# Patient Record
Sex: Male | Born: 1990 | Race: White | Hispanic: No | Marital: Single | Smoking: Former smoker
Health system: Southern US, Community
[De-identification: ages and names within clinical notes are randomized; demographics above are authoritative.]

## PROBLEM LIST (undated history)

## (undated) HISTORY — PX: NOSE SURGERY: SHX723

---
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
## 2010-07-04 ENCOUNTER — Ambulatory Visit (HOSPITAL_BASED_OUTPATIENT_CLINIC_OR_DEPARTMENT_OTHER)
Admission: RE | Admit: 2010-07-04 | Discharge: 2010-07-04 | Disposition: A | Discharge status: Home / Self Care | Payer: BC Managed Care – PPO | Source: Ambulatory Visit | Attending: Otolaryngology | Admitting: Otolaryngology

## 2010-07-04 DIAGNOSIS — J342 Deviated nasal septum: Secondary | ICD-10-CM | POA: Insufficient documentation

## 2010-07-04 DIAGNOSIS — J343 Hypertrophy of nasal turbinates: Secondary | ICD-10-CM | POA: Insufficient documentation

## 2010-07-04 LAB — POCT HEMOGLOBIN-HEMACUE: Hemoglobin: 15.7 g/dL (ref 13.0–17.0)

## 2010-07-11 NOTE — Op Note (Signed)
NAME:  Zachary Crawford, SITTER             ACCOUNT NO.:  [id number]  MEDICAL RECORD NO.:  [id number]           PATIENT TYPE:  LOCATION:                                 FACILITY:  PHYSICIAN:  Newman Pies, MD                 DATE OF BIRTH:  DATE OF PROCEDURE:  07/04/2010 DATE OF DISCHARGE:                              OPERATIVE REPORT   SURGEON:  Newman Pies, MD.  PREOPERATIVE DIAGNOSES: 1. Nasal septal deviation. 2. Bilateral inferior turbinate hypertrophy.  POSTOPERATIVE DIAGNOSES: 1. Nasal septal deviation. 2. Bilateral inferior turbinate hypertrophy.  PROCEDURE PERFORMED: 1. Septoplasty. 2. Bilateral partial inferior turbinate resection.  ANESTHESIA:  General endotracheal tube anesthesia.  COMPLICATIONS:  None.  ESTIMATED BLOOD LOSS:  50 mL.  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  The obstruction was usually worse on the left side.  The patient was previously treated with multiple steroid nasal sprays, antihistamine, and decongestants without improvement in his symptoms.  On examination, he was noted to have a severe nasal septal deviation to the left with a large left nasal septal spur.  His inferior turbinates were also significantly hypertrophied.  Based on the above findings, the decision was made for the patient to undergo the above-stated procedures.  The risks, benefits, alternatives, and details of the procedure of the procedures were discussed with the patient and his mother.  Questions were invited and answered.  Informed consent was obtained.  DESCRIPTION:  The patient was taken to the operating room and placed supine on the operating table.  General endotracheal tube anesthesia was administered by the anesthesiologist.  Preop IV antibiotics was given. The patient was positioned and prepped and draped in a standard fashion for nasal surgery.  Pledgets soaked with Afrin were  placed in both nasal cavities for vasoconstriction.  Both pledgets were subsequently removed. Examination of the nasal cavity revealed significant nasal septal deviation and a large left septal spur.  Both inferior turbinates were hypertrophied.  Attention was first focused on the septum.  Lidocaine 1% with 1:100,000 epinephrine were injected onto the nasal septum bilaterally.  A standard hemitransfixion incision was made on the left side.  The mucosal flap was elevated on the left side in a standard fashion.  A cartilaginous incision was made 1 cm superior to the caudal margin of the septum.  The mucosal flap was elevated in a standard fashion on the contralateral side.  The deviated portion of the cartilage and bony nasal septum were removed.  The cartilage was morselized and replaced.  The septum was quilted with 4-0 plain gut sutures.  The hemitransfixion incision was closed with interrupted 4-0 chromic sutures.  Attention was then focused on the turbinates.  The inferior one-half of each inferior turbinate was clamped with a straight Kelly clamp.  The inferior one-half of each inferior turbinate was resected with a pair of crosscutting scissors.  Hemostasis was achieved with suction electrocautery.  Doyle splints were applied to the nasal septum bilaterally with 2-0 Prolene suture.  That concluded the procedure for the patient.  The care of the patient was turned over to the anesthesiologist.  The patient was awakened from anesthesia without difficulty.  He was extubated and transferred to the recovery room in good condition.  OPERATIVE FINDINGS: 1. Nasal septal deviation. 2. Bilateral inferior turbinate hypertrophy.  SPECIMEN:  None.  FOLLOWUP CARE:  The patient will be discharged home once he is awake and alert.  He will be placed on Vicodin 1-2 tablets p.o. q.4-6 h. p.r.n. pain.  He will also be placed on amoxicillin 500 mg p.o. t.i.d. for 5 days.  The patient will follow  up in my office in 1 week.     Newman Pies, MD     ST/MEDQ  D:  07/04/2010  T:  07/05/2010  Job:  [id number]  Electronically Signed by Newman Pies MD on 07/11/2010 11:58:25 AM

## 2011-09-18 ENCOUNTER — Ambulatory Visit (INDEPENDENT_AMBULATORY_CARE_PROVIDER_SITE_OTHER): Payer: BC Managed Care – PPO | Admitting: Internal Medicine

## 2011-09-18 VITALS — BP 102/68 | HR 66 | Temp 99.0°F | Resp 16 | Ht 67.5 in | Wt 138.0 lb

## 2011-09-18 DIAGNOSIS — R070 Pain in throat: Secondary | ICD-10-CM

## 2011-09-18 DIAGNOSIS — J039 Acute tonsillitis, unspecified: Secondary | ICD-10-CM

## 2011-09-18 LAB — POCT CBC
Granulocyte percent: 74 %G (ref 37–80)
HCT, POC: 47.5 % (ref 43.5–53.7)
Hemoglobin: 15.4 g/dL (ref 14.1–18.1)
Lymph, poc: 1.6 (ref 0.6–3.4)
MCH, POC: 30.4 pg (ref 27–31.2)
MCHC: 32.4 g/dL (ref 31.8–35.4)
MPV: 9.3 fL (ref 0–99.8)
POC Granulocyte: 6.6 (ref 2–6.9)
POC MID %: 7.8 %M (ref 0–12)
RBC: 5.07 M/uL (ref 4.69–6.13)
RDW, POC: 12.7 %
WBC: 8.9 10*3/uL (ref 4.6–10.2)

## 2011-09-18 LAB — POCT RAPID STREP A (OFFICE): Rapid Strep A Screen: NEGATIVE

## 2011-09-18 MED ORDER — AMOXICILLIN 875 MG PO TABS: 875.0000 mg | ORAL_TABLET | Freq: Two times a day (BID) | ORAL | Status: AC

## 2011-09-18 NOTE — Progress Notes (Signed)
  Subjective:    Patient ID: Zachary Crawford, male    DOB: Feb 09, 1991, 21 y.o.   MRN: [id number]  HPI Sore throat fever and fatigue for 4 days No cough or congestion  [employer]-3rd year. Int [employer]. 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 Nodes tender/no PC nodes Chest clear    Results for orders placed in visit on 09/18/11  POCT RAPID STREP A (OFFICE)      Component Value Range   Rapid Strep A Screen Negative  Negative  POCT CBC      Component Value Range   WBC 8.9  4.6 - 10.2 K/uL   Lymph, poc 1.6  0.6 - 3.4   POC LYMPH PERCENT 18.2  10 - 50 %L   MID (cbc) 0.7  0 - 0.9   POC MID % 7.8  0 - 12 %M   POC Granulocyte 6.6  2 - 6.9   Granulocyte percent 74.0  37 - 80 %G   RBC 5.07  4.69 - 6.13 M/uL   Hemoglobin 15.4  14.1 - 18.1 g/dL   HCT, POC 62.1  30.8 - 53.7 %   MCV 93.6  80 - 97 fL   MCH, POC 30.4  27 - 31.2 pg   MCHC 32.4  31.8 - 35.4 g/dL   RDW, POC 65.7     Platelet Count, POC 274  142 - 424 K/uL   MPV 9.3  0 - 99.8 fL    Assessment & Plan:  P#1 tonsillitis TC sent  Start Amox 875

## 2011-09-20 LAB — CULTURE, GROUP A STREP

## 2011-12-11 ENCOUNTER — Ambulatory Visit (INDEPENDENT_AMBULATORY_CARE_PROVIDER_SITE_OTHER): Payer: BC Managed Care – PPO | Admitting: Family Medicine

## 2011-12-11 VITALS — BP 104/70 | HR 82 | Temp 98.5°F | Resp 16 | Ht 67.25 in | Wt 140.0 lb

## 2011-12-11 DIAGNOSIS — J40 Bronchitis, not specified as acute or chronic: Secondary | ICD-10-CM

## 2011-12-11 DIAGNOSIS — J209 Acute bronchitis, unspecified: Secondary | ICD-10-CM

## 2011-12-11 MED ORDER — AZITHROMYCIN 250 MG PO TABS: ORAL_TABLET | ORAL | Status: DC

## 2011-12-11 MED ORDER — HYDROCODONE-HOMATROPINE 5-1.5 MG/5ML PO SYRP: 5.0000 mL | ORAL_SOLUTION | Freq: Three times a day (TID) | ORAL | Status: DC | PRN

## 2011-12-11 NOTE — Progress Notes (Signed)
@  [email or web address]@   Patient ID: Zachary Crawford MRN: [id number], DOB: Mar 13, 1990, 21 y.o. Date of Encounter: 12/11/2011, 11:57 AM  Primary Physician: No primary provider on file.  Chief Complaint:  Chief Complaint  Patient presents with  . Cough    x 1 month  . Chills    since saturday  . Headache    off and on since saturday    HPI: 21 y.o. year old male presents with a 30 day history of nasal congestion, post nasal drip, sore throat, and cough. Mild sinus pressure. Afebrile. No chills. Nasal congestion is mild. Cough is productive of yellow sputum and not associated with time of day. Ears feel full, leading to sensation of muffled hearing. Has tried OTC cold preps without success. No GI complaints.  No sick contacts, recent antibiotics, or recent travels.   No leg trauma, sedentary periods, h/o cancer, or tobacco use.  No past medical history on file.   Home Meds: Prior to Admission medications   Not on File    Allergies: No Known Allergies  History   Social History  . Marital Status: Single    Spouse Name: N/A    Number of Children: N/A  . Years of Education: N/A   Occupational History  . Not on file.   Social History Main Topics  . Smoking status: Never Smoker   . Smokeless tobacco: Not on file  . Alcohol Use: Not on file  . Drug Use: Not on file  . Sexually Active: Not on file   Other Topics Concern  . Not on file   Social History Narrative  . No narrative on file     Review of Systems Constitutional: negative for  weight changes Cardiovascular: negative for chest pain or palpitations Respiratory: negative for hemoptysis, wheezing, or shortness of breath Abdominal: negative for abdominal pain, nausea, vomiting or diarrhea Dermatological: negative for rash Neurologic: negative for headache   Physical Exam: Blood pressure 104/70, pulse 82, temperature 98.5 F (36.9 C), temperature source Oral, resp. rate 16, height 5' 7.25" (1.708 m), weight 140 lb  (63.504 kg), SpO2 98.00%., Body mass index is 21.76 kg/(m^2). General: Well developed, well nourished, in no acute distress. Head: Normocephalic, atraumatic, eyes without discharge, sclera non-icteric, nares are congested. Bilateral auditory canals clear, TM's are without perforation, pearly grey with reflective cone of light bilaterally. No sinus TTP. Oral cavity moist, dentition normal. Posterior pharynx with post nasal drip and mild erythema. No peritonsillar abscess or tonsillar exudate. Neck: Supple. No thyromegaly. Full ROM. No lymphadenopathy. Lungs: Coarse breath sounds bilaterally without wheezes, rales, or rhonchi. Breathing is unlabored.  Heart: RRR with S1 S2. No murmurs, rubs, or gallops appreciated. Msk:  Strength and tone normal for age. Extremities: No clubbing or cyanosis. No edema. Neuro: Alert and oriented X 3. Moves all extremities spontaneously. CNII-XII grossly in tact. Psych:  Responds to questions appropriately with a normal affect.     ASSESSMENT AND PLAN:  21 y.o. year old male with bronchitis. - 1. Bronchitis, acute  azithromycin (ZITHROMAX Z-PAK) 250 MG tablet, HYDROcodone-homatropine (HYCODAN) 5-1.5 MG/5ML syrup    -Mucinex -Tylenol/Motrin prn -Rest/fluids -RTC precautions -RTC 3-5 days if no improvement  Signed, Elvina Sidle, MD 12/11/2011 11:57 AM

## 2012-06-17 ENCOUNTER — Ambulatory Visit (INDEPENDENT_AMBULATORY_CARE_PROVIDER_SITE_OTHER): Payer: BC Managed Care – PPO | Admitting: Emergency Medicine

## 2012-06-17 VITALS — BP 133/71 | HR 59 | Temp 98.5°F | Resp 16 | Ht 67.5 in | Wt 142.0 lb

## 2012-06-17 DIAGNOSIS — S76911A Strain of unspecified muscles, fascia and tendons at thigh level, right thigh, initial encounter: Secondary | ICD-10-CM

## 2012-06-17 DIAGNOSIS — IMO0002 Reserved for concepts with insufficient information to code with codable children: Secondary | ICD-10-CM

## 2012-06-17 MED ORDER — NAPROXEN SODIUM 550 MG PO TABS: 550.0000 mg | ORAL_TABLET | Freq: Two times a day (BID) | ORAL | Status: DC

## 2012-06-17 NOTE — Patient Instructions (Addendum)
Muscle Strain °Muscle strain occurs when a muscle is stretched beyond its normal length. A small number of muscle fibers generally are torn. This is especially common in athletes. This happens when a sudden, violent force placed on a muscle stretches it too far. Usually, recovery from muscle strain takes 1 to 2 weeks. Complete healing will take 5 to 6 weeks.  °HOME CARE INSTRUCTIONS  °· While awake, apply ice to the sore muscle for the first 2 days after the injury. °· Put ice in a plastic bag. °· Place a towel between your skin and the bag. °· Leave the ice on for 15 to 20 minutes each hour. °· Do not use the strained muscle for several days, until you no longer have pain. °· You may wrap the injured area with an elastic bandage for comfort. Be careful not to wrap it too tightly. This may interfere with blood circulation or increase swelling. °· Only take over-the-counter or prescription medicines for pain, discomfort, or fever as directed by your caregiver. °SEEK MEDICAL CARE IF:  °You have increasing pain or swelling in the injured area. °MAKE SURE YOU:  °· Understand these instructions. °· Will watch your condition. °· Will get help right away if you are not doing well or get worse. °Document Released: 02/05/2005 Document Revised: 04/30/2011 Document Reviewed: 02/17/2011 °ExitCare® Patient Information ©2013 [employer]. ° °

## 2012-06-17 NOTE — Progress Notes (Signed)
[facility] [address] [place] [address] [phone]  Date:  06/17/2012   Name:  Zachary Crawford   DOB:  1990-05-08   MRN:  [id number]  PCP:  No primary provider on file.    Chief Complaint: Groin Injury   History of Present Illness:  Zachary Crawford is a 22 y.o. very pleasant male patient who presents with the following:  Injured right groin while playing indoor soccer last Monday.  Says that he stretched and felt a pop and had pain.  Now he has an ecchymosis down his right thigh.  No radicular symptoms. Has pain with activity.  No improvement with over the counter medications or other home remedies. Denies other complaint or health concern today.   There are no active problems to display for this patient.   History reviewed. No pertinent past medical history.  History reviewed. No pertinent past surgical history.  History  Substance Use Topics  . Smoking status: Never Smoker   . Smokeless tobacco: Not on file  . Alcohol Use: Not on file    Family History  Problem Relation Age of Onset  . Heart disease Maternal Grandmother     No Known Allergies  Medication list has been reviewed and updated.  Current Outpatient Prescriptions on File Prior to Visit  Medication Sig Dispense Refill  . azithromycin (ZITHROMAX Z-PAK) 250 MG tablet Take as directed on pack  6 tablet  0  . HYDROcodone-homatropine (HYCODAN) 5-1.5 MG/5ML syrup Take 5 mLs by mouth every 8 (eight) hours as needed for cough.  120 mL  0   No current facility-administered medications on file prior to visit.    Review of Systems:  As per HPI, otherwise negative.    Physical Examination: Filed Vitals:   06/17/12 1443  BP: 133/71  Pulse: 59  Temp: 98.5 F (36.9 C)  Resp: 16   Filed Vitals:   06/17/12 1443  Height: 5' 7.5" (1.715 m)  Weight: 142 lb (64.411 kg)   Body mass index is 21.9 kg/(m^2). Ideal Body Weight: Weight in (lb) to have BMI = 25: 161.7   GEN: WDWN,  NAD, Non-toxic, Alert & Oriented x 3 HEENT: Atraumatic, Normocephalic.  Ears and Nose: No external deformity. EXTR: No clubbing/cyanosis/edema NEURO: Normal gait.  PSYCH: Normally interactive. Conversant. Not depressed or anxious appearing.  Calm demeanor.  Right thigh:  Ecchymosis medial and proximal thigh.  Some tenderness over sartorus.  Neuro grossly intact  Assessment and Plan: Muscle strain Anaprox Follow up with sports medicine Local heat  Signed,  Phillips Odor, MD

## 2012-10-16 ENCOUNTER — Ambulatory Visit (INDEPENDENT_AMBULATORY_CARE_PROVIDER_SITE_OTHER): Payer: BC Managed Care – PPO | Admitting: Physician Assistant

## 2012-10-16 VITALS — BP 122/76 | HR 70 | Temp 97.8°F | Resp 18 | Ht 67.5 in | Wt 142.8 lb

## 2012-10-16 DIAGNOSIS — K649 Unspecified hemorrhoids: Secondary | ICD-10-CM

## 2012-10-16 DIAGNOSIS — Z7251 High risk heterosexual behavior: Secondary | ICD-10-CM

## 2012-10-16 DIAGNOSIS — K5900 Constipation, unspecified: Secondary | ICD-10-CM

## 2012-10-16 DIAGNOSIS — Z0000 Encounter for general adult medical examination without abnormal findings: Secondary | ICD-10-CM

## 2012-10-16 LAB — COMPREHENSIVE METABOLIC PANEL
AST: 27 U/L (ref 0–37)
Alkaline Phosphatase: 62 U/L (ref 39–117)
BUN: 19 mg/dL (ref 6–23)
Calcium: 9.4 mg/dL (ref 8.4–10.5)
Chloride: 103 mEq/L (ref 96–112)
Creat: 1.07 mg/dL (ref 0.50–1.35)
Glucose, Bld: 91 mg/dL (ref 70–99)

## 2012-10-16 LAB — POCT URINALYSIS DIPSTICK
Blood, UA: NEGATIVE
Nitrite, UA: NEGATIVE
Spec Grav, UA: 1.015
Urobilinogen, UA: 0.2
pH, UA: 8

## 2012-10-16 LAB — POCT UA - MICROSCOPIC ONLY
Casts, Ur, LPF, POC: NEGATIVE
Epithelial cells, urine per micros: NEGATIVE
Mucus, UA: NEGATIVE
Yeast, UA: NEGATIVE

## 2012-10-16 LAB — LIPID PANEL
Cholesterol: 135 mg/dL (ref 0–200)
HDL: 49 mg/dL (ref >39)
LDL Cholesterol: 68 mg/dL (ref 0–99)
Triglycerides: 90 mg/dL (ref <150)

## 2012-10-16 LAB — POCT CBC
HCT, POC: 46.9 % (ref 43.5–53.7)
Hemoglobin: 15.4 g/dL (ref 14.1–18.1)
Lymph, poc: 1.6 (ref 0.6–3.4)
MCH, POC: 31.8 pg — AB (ref 27–31.2)
MCHC: 32.8 g/dL (ref 31.8–35.4)
MCV: 96.8 fL (ref 80–97)
POC MID %: 8.1 %M (ref 0–12)
WBC: 5.2 10*3/uL (ref 4.6–10.2)

## 2012-10-16 MED ORDER — AZITHROMYCIN 250 MG PO TABS: ORAL_TABLET | ORAL | Status: DC

## 2012-10-16 MED ORDER — HYDROCORTISONE 2.5 % RE CREA: TOPICAL_CREAM | Freq: Two times a day (BID) | RECTAL | Status: DC

## 2012-10-16 MED ORDER — POLYETHYLENE GLYCOL 3350 17 GM/SCOOP PO POWD: 17.0000 g | Freq: Every day | ORAL | Status: DC

## 2012-10-16 NOTE — Progress Notes (Signed)
Patient ID: Zachary Crawford MRN: [id number], DOB: [birth year] 22 y.o. Date of Encounter: 10/16/2012, 12:09 PM  Primary Physician: No PCP Per Patient  Chief Complaint: Physical (CPE)  HPI: 22 y.o. male with history noted below here for CPE. Doing well. Unsure of when his last CPE was. Generally healthy. Originally from [place]. Moved to [place] during the war and then to the [place] about 16 years ago. Still has family in [place].   Requests STD testing today. Was called on 10/10/12 by recent male sexual partner that she tested positive for Chlamydia. He is asymptomatic. No lesions.   He also notes a sore spot along the right side of his bottom. He does have to strain to pass a BM. He does sit on the toilet for an extended time occasionally. He also lifts weights/exercises frequently and wonders if this is playing a role.   Review of Systems: Consitutional: No fever, chills, fatigue, night sweats, lymphadenopathy, or weight changes. Eyes: No visual changes, eye redness, or discharge. ENT/Mouth: Ears: No otalgia, tinnitus, hearing loss, discharge. Nose: No congestion, rhinorrhea, sinus pain, or epistaxis. Throat: No sore throat, post nasal drip, or teeth pain. Cardiovascular: No CP, palpitations, diaphoresis, DOE, edema, orthopnea, PND. Respiratory: No cough, hemoptysis, SOB, or wheezing. Gastrointestinal: Positive for constipation. No anorexia, dysphagia, reflux, pain, nausea, vomiting, hematemesis, diarrhea, BRBPR, or melena. Genitourinary: No dysuria, frequency, urgency, hematuria, incontinence, nocturia, decreased urinary stream, discharge, impotence, or testicular pain/masses. Musculoskeletal: No decreased ROM, myalgias, stiffness, joint swelling, or weakness. Skin: No rash, erythema, lesion changes, pain, warmth, jaundice, or pruritis. Neurological: No headache, dizziness, syncope, seizures, tremors, memory loss, coordination problems, or paresthesias. Psychological: No anxiety,  depression, hallucinations, SI/HI. Endocrine: No fatigue, polydipsia, polyphagia, polyuria, or known diabetes.   History reviewed. No pertinent past medical history.   History reviewed. No pertinent past surgical history.  Home Meds:  Prior to Admission medications   Not on File    Allergies: No Known Allergies  History   Social History  . Marital Status: Single    Spouse Name: N/A    Number of Children: N/A  . Years of Education: N/A   Occupational History  . Not on file.   Social History Main Topics  . Smoking status: Current Every Day Smoker    Types: Cigarettes    Start date: 10/17/2011  . Smokeless tobacco: Not on file  . Alcohol Use: Yes  . Drug Use: No  . Sexual Activity: Not on file   Other Topics Concern  . Not on file   Social History Narrative  . No narrative on file    Family History  Problem Relation Age of Onset  . Heart disease Maternal Grandmother     Physical Exam: Blood pressure 122/76, pulse 70, temperature 97.8 F (36.6 C), temperature source Oral, resp. rate 18, height 5' 7.5" (1.715 m), weight 142 lb 12.8 oz (64.774 kg), SpO2 99.00%.  General: Well developed, well nourished, in no acute distress. HEENT: Normocephalic, atraumatic. Conjunctiva pink, sclera non-icteric. Pupils 2 mm constricting to 1 mm, round, regular, and equally reactive to light and accomodation. EOMI. Internal auditory canal clear. TMs with good cone of light and without pathology. Nasal mucosa pink. Nares are without discharge. No sinus tenderness. Oral mucosa pink. Dentition normal. Pharynx without exudate. Uvula midline.  Neck: Supple. Trachea midline. No thyromegaly. Full ROM. No lymphadenopathy. Lungs: Clear to auscultation bilaterally without wheezes, rales, or rhonchi. Breathing is of normal effort and unlabored. Cardiovascular: RRR with S1 S2. No murmurs, rubs,  or gallops appreciated. Distal pulses 2+ symmetrically. No carotid or abdominal bruits. Abdomen: Soft,  non-tender, non-distended with normoactive bowel sounds. No hepatosplenomegaly or masses. No rebound/guarding. No CVA tenderness. Without hernias.  Rectal: Non-thrombosed external hemorrhoid. No fissures.   Genitourinary: Circumcised male. No penile lesions. Testes descended and smooth without tenderness or masses.  Musculoskeletal: Full range of motion and 5/5 strength throughout. Without swelling, atrophy, tenderness, crepitus, or warmth. Extremities without clubbing, cyanosis, or edema. Calves supple. Skin: Warm and moist without erythema, ecchymosis, wounds, or rash. Neuro: A+Ox3. CN II-XII grossly intact. Moves all extremities spontaneously. Full sensation throughout. Normal gait. DTR 2+ throughout upper and lower extremities. Finger to nose intact. Psych:  Responds to questions appropriately with a normal affect.   Studies:  Results for orders placed in visit on 10/16/12  POCT CBC      Result Value Range   WBC 5.2  4.6 - 10.2 K/uL   Lymph, poc 1.6  0.6 - 3.4   POC LYMPH PERCENT 31.7  10 - 50 %L   MID (cbc) 0.4  0 - 0.9   POC MID % 8.1  0 - 12 %M   POC Granulocyte 3.1  2 - 6.9   Granulocyte percent 60.2  37 - 80 %G   RBC 4.85  4.69 - 6.13 M/uL   Hemoglobin 15.4  14.1 - 18.1 g/dL   HCT, POC 16.1  09.6 - 53.7 %   MCV 96.8  80 - 97 fL   MCH, POC 31.8 (*) 27 - 31.2 pg   MCHC 32.8  31.8 - 35.4 g/dL   RDW, POC 04.5     Platelet Count, POC 268  142 - 424 K/uL   MPV 9.1  0 - 99.8 fL  POCT UA - MICROSCOPIC ONLY      Result Value Range   WBC, Ur, HPF, POC neg     RBC, urine, microscopic neg     Bacteria, U Microscopic neg     Mucus, UA neg     Epithelial cells, urine per micros neg     Crystals, Ur, HPF, POC neg     Casts, Ur, LPF, POC neg     Yeast, UA neg    POCT URINALYSIS DIPSTICK      Result Value Range   Color, UA yellow     Clarity, UA clear     Glucose, UA neg     Bilirubin, UA neg     Ketones, UA neg     Spec Grav, UA 1.015     Blood, UA neg     pH, UA 8.0      Protein, UA neg     Urobilinogen, UA 0.2     Nitrite, UA neg     Leukocytes, UA Negative       CBC, CMET, Lipid, PSA, TSH HIV, RPR, HSV 1&2 uriprobe all pending. Patient is not fasting.    Assessment/Plan:  22 y.o. male here for CPE with Chlamydia exposure, constipation, and nonthrombosed external hemorrhoid.   1) CPE -Healthy adult male physical -Continue to taper off tobacco -Healthy lifestyle choices -Anticipatory guidances   2) Chlamydia exposure -Azithromycin 250 mg Take all 4 tabs po now #4 no RF -Await labs -Patient to wait for results before taking above Rx  3) Constipation -PEG 17 grams daily #3350 grams RF 2 -Increase fiber -Increase water  4) Nonthrombosed external hemorrhoid -Anusol 2.5% Apply bid #30 grams RF 2 -PEG -Increase water -Increase fiber -Do not strain -  If worsens RTC  Signed, Eula Listen, PA-C 10/16/2012 12:09 PM

## 2012-10-17 LAB — GC/CHLAMYDIA PROBE AMP: GC Probe RNA: NEGATIVE

## 2012-10-17 LAB — HSV(HERPES SIMPLEX VRS) I + II AB-IGG
HSV 1 Glycoprotein G Ab, IgG: 3.5 IV — ABNORMAL HIGH
HSV 2 Glycoprotein G Ab, IgG: 0.14 IV

## 2014-04-11 ENCOUNTER — Ambulatory Visit (INDEPENDENT_AMBULATORY_CARE_PROVIDER_SITE_OTHER): Payer: BLUE CROSS/BLUE SHIELD | Admitting: Physician Assistant

## 2014-04-11 VITALS — BP 118/76 | HR 71 | Temp 98.0°F | Resp 16 | Ht 67.5 in | Wt 146.2 lb

## 2014-04-11 DIAGNOSIS — Z23 Encounter for immunization: Secondary | ICD-10-CM

## 2014-04-11 NOTE — Progress Notes (Signed)
[facility] [address], [place] [id number] [phone]  Date:  04/11/2014   Name:  Zachary Crawford   DOB:  [birth year]   MRN:  [id number]  PCP:  No PCP Per Patient    Chief Complaint: Immunizations   History of Present Illness:  Zachary BandaVedad Dutt is a 24 y.o. very pleasant male patient who presents with the following:  Patient is here to update immunization record.  Patient has [facility] [employer] [place].  He has also attended [employer].  He is studying international business.  He brings an immunization record sheet with him [place] [facility].    There are no active problems to display for this patient.  History reviewed. No pertinent past medical history.  History reviewed. No pertinent past surgical history.  History  Substance Use Topics  . Smoking status: Current Every Day Smoker    Types: Cigarettes    Start date: 10/17/2011  . Smokeless tobacco: Not on file  . Alcohol Use: Yes    Family History  Problem Relation Age of Onset  . Heart disease Maternal Grandmother     No Known Allergies  Medication list has been reviewed and updated.  Current Outpatient Prescriptions on File Prior to Visit  Medication Sig Dispense Refill  . azithromycin (ZITHROMAX Z-PAK) 250 MG tablet Take all 4 tabs by mouth now. 4 tablet 0  . hydrocortisone (ANUSOL-HC) 2.5 % rectal cream Place rectally 2 (two) times daily. 30 g 2  . polyethylene glycol powder (GLYCOLAX/MIRALAX) powder Take 17 g by mouth daily. 3350 g 2   No current facility-administered medications on file prior to visit.    Review of Systems: ROS otherwise unremarkable unless listed above.  Physical Examination: Filed Vitals:   04/11/14 1055  BP: 118/76  Pulse: 71  Temp: 98 F (36.7 C)  Resp: 16   Filed Vitals:   04/11/14 1055  Height: 5' 7.5" (1.715 m)  Weight: 146 lb 3.2 oz (66.316 kg)   Body mass index is 22.55 kg/(m^2). Ideal Body Weight: Weight  in (lb) to have BMI = 25: 161.7 Physical Exam  Constitutional: He appears well-developed and well-nourished. No distress.  HENT:  Head: Normocephalic and atraumatic.  Eyes: Pupils are equal, round, and reactive to light. Right eye exhibits no discharge. Left eye exhibits no discharge.  Neck: Normal range of motion.  Cardiovascular: Normal rate.   Pulmonary/Chest: Effort normal. No respiratory distress.  Psychiatric: He has a normal mood and affect. His behavior is normal.  Vitals reviewed.    Assessment and Plan: 24 year old male is here today to update immunizations.  Recommended Hep A for international travel.  He will return in 6 months for 2nd vaccination.  Need for Tdap vaccination - Plan: Tdap vaccine greater than or equal to 7yo IM  Need for hepatitis A vaccination - Plan: Hepatitis A vaccine adult IM  Trena PlattStephanie Mathayus Stanbery, PA-C [facility] 2/22/20169:11 AM

## 2015-04-06 ENCOUNTER — Ambulatory Visit (INDEPENDENT_AMBULATORY_CARE_PROVIDER_SITE_OTHER): Payer: BLUE CROSS/BLUE SHIELD | Admitting: Physician Assistant

## 2015-04-06 VITALS — BP 138/80 | HR 67 | Temp 98.2°F | Resp 16 | Ht 67.25 in | Wt 146.8 lb

## 2015-04-06 DIAGNOSIS — J069 Acute upper respiratory infection, unspecified: Secondary | ICD-10-CM | POA: Diagnosis not present

## 2015-04-06 DIAGNOSIS — K529 Noninfective gastroenteritis and colitis, unspecified: Secondary | ICD-10-CM

## 2015-04-06 DIAGNOSIS — Z113 Encounter for screening for infections with a predominantly sexual mode of transmission: Secondary | ICD-10-CM | POA: Diagnosis not present

## 2015-04-06 DIAGNOSIS — B9789 Other viral agents as the cause of diseases classified elsewhere: Principal | ICD-10-CM

## 2015-04-06 LAB — COMPREHENSIVE METABOLIC PANEL
ALT: 13 U/L (ref 9–46)
AST: 14 U/L (ref 10–40)
Albumin: 4.4 g/dL (ref 3.6–5.1)
Alkaline Phosphatase: 53 U/L (ref 40–115)
BUN: 10 mg/dL (ref 7–25)
CALCIUM: 9.6 mg/dL (ref 8.6–10.3)
CO2: 29 mmol/L (ref 20–31)
Chloride: 103 mmol/L (ref 98–110)
Creat: 1.11 mg/dL (ref 0.60–1.35)
GLUCOSE: 97 mg/dL (ref 65–99)
POTASSIUM: 4.7 mmol/L (ref 3.5–5.3)
Sodium: 140 mmol/L (ref 135–146)
Total Bilirubin: 0.6 mg/dL (ref 0.2–1.2)
Total Protein: 7.3 g/dL (ref 6.1–8.1)

## 2015-04-06 LAB — CBC
HEMATOCRIT: 46.9 % (ref 39.0–52.0)
Hemoglobin: 15.9 g/dL (ref 13.0–17.0)
MCH: 30.1 pg (ref 26.0–34.0)
MCHC: 33.9 g/dL (ref 30.0–36.0)
MCV: 88.8 fL (ref 78.0–100.0)
MPV: 10.7 fL (ref 8.6–12.4)
Platelets: 266 10*3/uL (ref 150–400)
RBC: 5.28 MIL/uL (ref 4.22–5.81)
RDW: 13.4 % (ref 11.5–15.5)
WBC: 7.9 10*3/uL (ref 4.0–10.5)

## 2015-04-06 MED ORDER — IPRATROPIUM BROMIDE 0.03 % NA SOLN: 2.0000 | Freq: Two times a day (BID) | NASAL | Status: DC

## 2015-04-06 MED ORDER — HYDROCOD POLST-CPM POLST ER 10-8 MG/5ML PO SUER: 5.0000 mL | Freq: Two times a day (BID) | ORAL | Status: DC | PRN

## 2015-04-06 MED ORDER — BENZONATATE 100 MG PO CAPS: 100.0000 mg | ORAL_CAPSULE | Freq: Three times a day (TID) | ORAL | Status: DC | PRN

## 2015-04-06 NOTE — Patient Instructions (Addendum)
Drink plenty of water (64 oz/day) and get plenty of rest. If you have been prescribed a cough syrup, do not drive or operate heavy machinery while using this medication. If you have been prescribed a nasal spray, use twice a day. Tessalon for cough during the day. Ibuprofen/tylenol for sore throat. If your symptoms are not improving in 1 week, return to clinic.   I will call you with your lab tests.

## 2015-04-06 NOTE — Progress Notes (Signed)
[facility] [address] [place] [address] [phone]  Date:  04/06/2015   Name:  Zachary Crawford   DOB:  17-Jul-1990   MRN:  [id number]  PCP:  No PCP Per Patient    Chief Complaint: Sore Throat; Cough; Nasal Congestion; Labs Only; and Flu Vaccine   History of Present Illness:  This is a 25 y.o. male who is presenting with sore throat, cough, nasal congestion x 3 days. Started with mild cough. Cough is productive of green mucus. Throat "feels like it's tearing up". No fevers or chills. Having some mild wheezing and sob. Denies otalgia.  Aggravating/alleviating factors: nyquil, allergy medicine from [place] History of asthma: no History of env allergies: yes, usually in spring time Tobacco use: socially  Pt wants blood work today. States his mom wants him to have this done. He states he has frequent BMs that his mom is worried about. He states he is not worried about this, only has frequent BMs if he drinks coffee. He wants to be tested for STDs today.  No DM, HTN, CVA, cancer in family.  Review of Systems:  Review of Systems See HPI  There are no active problems to display for this patient.   Prior to Admission medications   Medication Sig Start Date End Date Taking? Authorizing Provider  Pseudoeph-Doxylamine-DM-APAP (NYQUIL PO) Take by mouth.   Yes Historical Provider, MD    No Known Allergies  History reviewed. No pertinent past surgical history.  Social History  Substance Use Topics  . Smoking status: Current Every Day Smoker    Types: Cigarettes    Start date: 10/17/2011  . Smokeless tobacco: None  . Alcohol Use: Yes    Family History  Problem Relation Age of Onset  . Heart disease Maternal Grandmother     Medication list has been reviewed and updated.  Physical Examination:  Physical Exam  Constitutional: He is oriented to person, place, and time. He appears well-developed and well-nourished. No distress.  HENT:   Head: Normocephalic and atraumatic.  Right Ear: Hearing, tympanic membrane, external ear and ear canal normal.  Left Ear: Hearing, tympanic membrane, external ear and ear canal normal.  Nose: Nose normal.  Mouth/Throat: Uvula is midline, oropharynx is clear and moist and mucous membranes are normal.  Eyes: Conjunctivae and lids are normal. Right eye exhibits no discharge. Left eye exhibits no discharge. No scleral icterus.  Cardiovascular: Normal rate, regular rhythm, normal heart sounds and normal pulses.   No murmur heard. Pulmonary/Chest: Effort normal and breath sounds normal. No respiratory distress. He has no wheezes. He has no rhonchi. He has no rales.  Musculoskeletal: Normal range of motion.  Lymphadenopathy:       Head (right side): No submental, no submandibular and no tonsillar adenopathy present.       Head (left side): Tonsillar adenopathy present. No submental and no submandibular adenopathy present.    He has no cervical adenopathy.  Neurological: He is alert and oriented to person, place, and time.  Skin: Skin is warm, dry and intact. No lesion and no rash noted.  Psychiatric: He has a normal mood and affect. His speech is normal and behavior is normal. Thought content normal.   BP 138/80 mmHg  Pulse 67  Temp(Src) 98.2 F (36.8 C) (Oral)  Resp 16  Ht 5' 7.25" (1.708 m)  Wt 146 lb 12.8 oz (66.588 kg)  BMI 22.83 kg/m2  SpO2 98%  Assessment and Plan:  1. Viral URI  with cough Suspect viral etiology. Focus is on supportive care, see meds prescribed below. Return in 1 week if symptoms do not improve or at any time if symptoms worsen.  - ipratropium (ATROVENT) 0.03 % nasal spray; Place 2 sprays into both nostrils 2 (two) times daily.  Dispense: 30 mL; Refill: 0 - chlorpheniramine-HYDROcodone (TUSSIONEX PENNKINETIC ER) 10-8 MG/5ML SUER; Take 5 mLs by mouth every 12 (twelve) hours as needed for cough.  Dispense: 100 mL; Refill: 0 - benzonatate (TESSALON) 100 MG capsule;  Take 1-2 capsules (100-200 mg total) by mouth 3 (three) times daily as needed for cough.  Dispense: 40 capsule; Refill: 0  2. Screen for STD (sexually transmitted disease) - HIV 1 RNA quant-no reflex-bld - RPR - GC/Chlamydia Probe Amp  3. Frequent stools Sounds like d/t coffee intake. Pt's mom wants baseline labs for him. - CBC - Comprehensive metabolic panel   Zachary Crawford. Zachary Crawford, MHS [facility]  04/06/2015

## 2015-04-07 LAB — GC/CHLAMYDIA PROBE AMP
CT PROBE, AMP APTIMA: NOT DETECTED
GC PROBE AMP APTIMA: NOT DETECTED

## 2015-04-07 LAB — RPR

## 2015-04-08 ENCOUNTER — Encounter: Payer: Self-pay | Admitting: Physician Assistant

## 2015-04-08 LAB — HIV ANTIBODY (ROUTINE TESTING W REFLEX): HIV 1&2 Ab, 4th Generation: NONREACTIVE

## 2015-04-08 LAB — HIV-1 RNA QUANT-NO REFLEX-BLD

## 2015-09-05 ENCOUNTER — Ambulatory Visit (INDEPENDENT_AMBULATORY_CARE_PROVIDER_SITE_OTHER): Payer: BLUE CROSS/BLUE SHIELD | Admitting: Physician Assistant

## 2015-09-05 VITALS — BP 110/70 | HR 65 | Temp 97.9°F | Resp 16 | Ht 67.75 in | Wt 151.2 lb

## 2015-09-05 DIAGNOSIS — H9202 Otalgia, left ear: Secondary | ICD-10-CM

## 2015-09-05 DIAGNOSIS — J302 Other seasonal allergic rhinitis: Secondary | ICD-10-CM | POA: Diagnosis not present

## 2015-09-05 MED ORDER — NEOMYCIN-POLYMYXIN-HC 3.5-10000-1 OT SOLN: 5.0000 [drp] | Freq: Four times a day (QID) | OTIC | Status: DC

## 2015-09-05 MED ORDER — NEOMYCIN-POLYMYXIN-HC 3.5-10000-1 OT SOLN: 5.0000 [drp] | Freq: Four times a day (QID) | OTIC | Status: AC

## 2015-09-05 NOTE — Progress Notes (Signed)
   Zachary Crawford  MRN: [id number] DOB: Mar 20, 1990  Subjective:  Patient is a 25 y.o. male seen in office today for a chief complaint of left ear pain x 2 days. Went swimming at an apartment pool two days ago and noticed the pain later that evening after showering. Has associated tinnitus and decreased hearing in left ear. Denies drainage, odor, recent trauma or qtip use, and right ear symptoms. Tried OTC [place] Earache drops without relief.   Review of Systems  Constitutional: Negative for fever and fatigue.  HENT: Negative for congestion, postnasal drip, rhinorrhea, sneezing and sore throat.   Respiratory: Negative for chest tightness.   Neurological: Positive for headaches.   No PMH of ear infections.   There are no active problems to display for this patient.   No current outpatient prescriptions on file prior to visit.   No current facility-administered medications on file prior to visit.    No Known Allergies  Objective:  BP 110/70 mmHg  Pulse 65  Temp(Src) 97.9 F (36.6 C) (Oral)  Resp 16  Ht 5' 7.75" (1.721 m)  Wt 151 lb 3.2 oz (68.584 kg)  BMI 23.16 kg/m2  SpO2 99%  Physical Exam  Constitutional: He is oriented to person, place, and time and well-developed, well-nourished, and in no distress.  HENT:  Head: Normocephalic and atraumatic.  Right Ear: Hearing, tympanic membrane, external ear and ear canal normal.  Left Ear: There is drainage ( moderate amount of thick light yellow draiange lining canal and overlyin TM  ) and swelling (mild in canal). No mastoid tenderness. Decreased hearing is noted.  Nose: Mucosal edema present.  Mouth/Throat: Uvula is midline, oropharynx is clear and moist and mucous membranes are normal.  Left TM not visualized.  Weber: lateralizes to left ear  Rinne: AC>BC in right ear. BC>AC in left ear.   Eyes: Conjunctivae are normal.  Neck: Normal range of motion.  Pulmonary/Chest: Effort normal.  Lymphadenopathy:       Head (right  side): Posterior auricular ( noted, patient states this is a normal finding for him) adenopathy present. No submental, no submandibular, no tonsillar, no preauricular and no occipital adenopathy present.       Head (left side): No submental, no submandibular, no tonsillar, no preauricular, no posterior auricular and no occipital adenopathy present.    He has no cervical adenopathy.  Neurological: He is alert and oriented to person, place, and time. Gait normal.  Skin: Skin is warm and dry.  Psychiatric: Affect normal.  Vitals reviewed.   Assessment and Plan :   1. Left ear pain -Suspect otitis externa  -neomycin-polymyxin-hydrocortisone (CORTISPORIN) otic solution; Place 5 drops into the left ear 4 (four) times daily.  Dispense: 10 mL; Refill: 0 -Return to clinic if symptoms worsen, do not improve, or as needed  2. Seasonal Allergies -OTC Flonase for swollen nasal turbinates and congestion. Continue daily OTC antihistamine when allergic symptoms are present.   Zachary CoreBrittany Jasiya Markie PA-C  [facility] 09/05/2015 9:16 AM

## 2015-09-05 NOTE — Patient Instructions (Addendum)
To use the ear drops: -Lie down or tilt your head with your ear facing upward. Open the ear canal by gently pulling your ear back, or pulling downward on the earlobe when giving this medicine to a child. -Hold the dropper upside down over your ear and drop the correct number of drops into the ear. -Stay lying down or with your head tilted for at least 5 minutes. You may use a small piece of cotton to plug the ear and keep the medicine from draining out. -Do not touch the dropper tip or place it directly in your ear. It may become contaminated. Wipe the tip with a clean tissue but do not wash with water or soap. -Use this medicine for the full prescribed length of time. Your symptoms may improve before the infection is completely cleared. Skipping doses may also increase your risk of further infection that is resistant to antibiotics.  -Five drops four times a day x 7 days  Otitis Externa Otitis externa is a bacterial or fungal infection of the outer ear canal. This is the area from the eardrum to the outside of the ear. Otitis externa is sometimes called "swimmer's ear." CAUSES  Possible causes of infection include:  Swimming in dirty water.  Moisture remaining in the ear after swimming or bathing.  Mild injury (trauma) to the ear.  Objects stuck in the ear (foreign body).  Cuts or scrapes (abrasions) on the outside of the ear. SIGNS AND SYMPTOMS  The first symptom of infection is often itching in the ear canal. Later signs and symptoms may include swelling and redness of the ear canal, ear pain, and yellowish-white fluid (pus) coming from the ear. The ear pain may be worse when pulling on the earlobe. DIAGNOSIS  Your health care provider will perform a physical exam. A sample of fluid may be taken from the ear and examined for bacteria or fungi. TREATMENT  Antibiotic ear drops are often given for 10 to 14 days. Treatment may also include pain medicine or corticosteroids to reduce itching  and swelling. HOME CARE INSTRUCTIONS   Apply antibiotic ear drops to the ear canal as prescribed by your health care provider.  Take medicines only as directed by your health care provider.  If you have diabetes, follow any additional treatment instructions from your health care provider.  Keep all follow-up visits as directed by your health care provider. PREVENTION   Keep your ear dry. Use the corner of a towel to absorb water out of the ear canal after swimming or bathing.  Avoid scratching or putting objects inside your ear. This can damage the ear canal or remove the protective wax that lines the canal. This makes it easier for bacteria and fungi to grow.  Avoid swimming in lakes, polluted water, or poorly chlorinated pools.  You may use ear drops made of rubbing alcohol and vinegar after swimming. Combine equal parts of white vinegar and alcohol in a bottle. Put 3 or 4 drops into each ear after swimming. SEEK MEDICAL CARE IF:   You have a fever.  Your ear is still red, swollen, painful, or draining pus after 3 days.  Your redness, swelling, or pain gets worse.  You have a severe headache.  You have redness, swelling, pain, or tenderness in the area behind your ear. MAKE SURE YOU:   Understand these instructions.  Will watch your condition.  Will get help right away if you are not doing well or get worse.   This  information is not intended to replace advice given to you by your health care provider. Make sure you discuss any questions you have with your health care provider.   Document Released: 02/05/2005 Document Revised: 02/26/2014 Document Reviewed: 02/22/2011 Elsevier Interactive Patient Education 2016 [employer]     IF you received an x-ray today, you will receive an invoice from [facility]. Please contact [facility] at [phone] with questions or concerns regarding your invoice.   IF you received labwork today, you will receive an  invoice from [facility]. Please contact [facility] at [phone] with questions or concerns regarding your invoice.   Our billing staff will not be able to assist you with questions regarding bills from these companies.  You will be contacted with the lab results as soon as they are available. The fastest way to get your results is to activate your My Chart account. Instructions are located on the last page of this paperwork. If you have not heard from us regarding the results in 2 weeks, please contact this office.

## 2015-12-17 ENCOUNTER — Ambulatory Visit (INDEPENDENT_AMBULATORY_CARE_PROVIDER_SITE_OTHER): Payer: BLUE CROSS/BLUE SHIELD

## 2015-12-17 ENCOUNTER — Ambulatory Visit (INDEPENDENT_AMBULATORY_CARE_PROVIDER_SITE_OTHER): Payer: BLUE CROSS/BLUE SHIELD | Admitting: Urgent Care

## 2015-12-17 VITALS — BP 124/76 | HR 70 | Temp 98.2°F | Resp 16 | Ht 67.75 in | Wt 150.0 lb

## 2015-12-17 DIAGNOSIS — S6991XA Unspecified injury of right wrist, hand and finger(s), initial encounter: Secondary | ICD-10-CM | POA: Diagnosis not present

## 2015-12-17 DIAGNOSIS — S6710XA Crushing injury of unspecified finger(s), initial encounter: Secondary | ICD-10-CM

## 2015-12-17 NOTE — Patient Instructions (Addendum)
Do not pick the scab off of your thumb nail. Eventually it will be replaced with healed tissue underneath. Your nail will likely fall off as well and be replaced by new healthy nail. This may take weeks depending on the rate of growth of your nail. Practice general hygiene for your hand. Keep it clean and dry otherwise.    IF you received an x-ray today, you will receive an invoice from [facility]. Please contact [facility] at [phone] with questions or concerns regarding your invoice.   IF you received labwork today, you will receive an invoice from [facility]. Please contact [facility] at [phone] with questions or concerns regarding your invoice.   Our billing staff will not be able to assist you with questions regarding bills from these companies.  You will be contacted with the lab results as soon as they are available. The fastest way to get your results is to activate your My Chart account. Instructions are located on the last page of this paperwork. If you have not heard from us regarding the results in 2 weeks, please contact this office.

## 2015-12-17 NOTE — Progress Notes (Signed)
Patient ID: Zachary Crawford, male   DOB: 04/12/1990, 25 y.o.   MRN: [id number]  By signing my name below I, Zachary Crawford, attest that this documentation has been prepared under the direction and in the presence of Zachary Crawford, [place]. Electonically Signed. Zachary Crawford, Scribe 12/17/2015 at 3:03 PM  MRN: [id number] DOB: 04/12/1990  Subjective:   Zachary Crawford is a 25 y.o. male presenting for chief complaint of Other (RIght thumb nail injury, smashed finger in car door 3 weeks ago. Nail is partially torn and black ) Right thumb was initially bruised and swollen for the first 3 days after injury. Swelling, pain, and bruising has completely resolved. Denies active pain, bleeding, redness or red streaks around injury. His primary concern is that it is infected since he tried to pick at his nail yesterday to remove his scab.  [name] is not currently taking any medications. Also has No Known Allergies.  Whit Denies past medical history. Denies past surgical history.   Objective:   Vitals: BP 124/76    Pulse 70    Temp 98.2 F (36.8 C) (Oral)    Resp 16    Ht 5' 7.75" (1.721 m)    Wt 150 lb (68 kg)    SpO2 98%    BMI 22.98 kg/m   Physical Exam  Constitutional: He is oriented to person, place, and time. He appears well-developed and well-nourished.  Cardiovascular: Normal rate.   Pulmonary/Chest: Effort normal.  Musculoskeletal:  Rt thumb exam: full and painless ROM. Thumb is non-tender. Strength 5/5  Neurological: He is alert and oriented to person, place, and time.  Sensation intact in rt distal thumb.   Skin:  Rt thumb exam: There is no erythema, drainage, edema, increased warmth, or streaking into hand or arm. Proximal nail absent, distal nail intact and viable. There is eschar over missing portion of nail.    Dg Finger Thumb Right  Result Date: 12/17/2015 CLINICAL DATA:  Crush injury 2 weeks ago. EXAM: RIGHT THUMB 2+V COMPARISON:  None. FINDINGS: No acute fracture. No dislocation.   Unremarkable soft tissues. IMPRESSION: No acute bone pathology. Electronically Signed   By: Zachary ClickArthur  Crawford M.D.   On: 12/17/2015 14:58   Assessment and Plan :   1. Crush injury to finger, initial encounter 2. Injury of right thumb, initial encounter - No evidence of thumb fracture, anticipatory guidance provided. RTC as needed.  Zachary BambergMario Mani, PA-C [facility] [phone] 12/17/2015 3:03 PM

## 2018-03-15 ENCOUNTER — Telehealth: Payer: Self-pay | Admitting: Urgent Care

## 2018-05-05 ENCOUNTER — Other Ambulatory Visit: Payer: Self-pay

## 2018-05-05 ENCOUNTER — Ambulatory Visit (HOSPITAL_COMMUNITY)
Admission: EM | Admit: 2018-05-05 | Discharge: 2018-05-05 | Disposition: A | Discharge status: Home / Self Care | Payer: No Typology Code available for payment source | Attending: Family Medicine | Admitting: Family Medicine

## 2018-05-05 ENCOUNTER — Encounter (HOSPITAL_COMMUNITY): Payer: Self-pay | Admitting: Emergency Medicine

## 2018-05-05 DIAGNOSIS — Z20828 Contact with and (suspected) exposure to other viral communicable diseases: Secondary | ICD-10-CM | POA: Insufficient documentation

## 2018-05-05 DIAGNOSIS — R0989 Other specified symptoms and signs involving the circulatory and respiratory systems: Secondary | ICD-10-CM | POA: Diagnosis present

## 2018-05-05 DIAGNOSIS — Z79899 Other long term (current) drug therapy: Secondary | ICD-10-CM | POA: Diagnosis not present

## 2018-05-05 DIAGNOSIS — F17210 Nicotine dependence, cigarettes, uncomplicated: Secondary | ICD-10-CM | POA: Diagnosis not present

## 2018-05-05 LAB — RESPIRATORY PANEL BY PCR
Adenovirus: NOT DETECTED
Bordetella pertussis: NOT DETECTED
Chlamydophila pneumoniae: NOT DETECTED
Coronavirus 229E: NOT DETECTED
Coronavirus HKU1: NOT DETECTED
Coronavirus NL63: NOT DETECTED
Coronavirus OC43: NOT DETECTED
Influenza A: NOT DETECTED
Influenza B: NOT DETECTED
METAPNEUMOVIRUS-RVPPCR: NOT DETECTED
Mycoplasma pneumoniae: NOT DETECTED
PARAINFLUENZA VIRUS 2-RVPPCR: NOT DETECTED
PARAINFLUENZA VIRUS 4-RVPPCR: NOT DETECTED
Parainfluenza Virus 1: NOT DETECTED
Parainfluenza Virus 3: NOT DETECTED
Respiratory Syncytial Virus: NOT DETECTED
Rhinovirus / Enterovirus: NOT DETECTED

## 2018-05-05 LAB — POCT INFLUENZA A/B
INFLUENZA A, POC: NEGATIVE
INFLUENZA B, POC: NEGATIVE

## 2018-05-05 NOTE — ED Provider Notes (Signed)
[facility]    CSN: [id number] Arrival date & time: 05/05/18  1011     History   Chief Complaint Chief Complaint  Patient presents with  . URI    HPI Zachary Crawford is a 28 y.o. male.    Zachary Crawford presents with complaints of of shortness of breath which started 3/13 while flying. He had flown to [place] 2/27 to visit family, then wen to [place] for a wedding, returned to [place], then flew to [place] for 5 days. On 3/13 returned from [place], flying from [place] to [place], to [employer] to [employer]. No known ill contacts. He felt improved until yesterday when again had a short of breath, tight sensation. States his chest feels like he rubbed icy hot on it. Dry throat. Headache this am which has improved. No cough, no runny nose. His mother has felt chest congestion as well. No ear pain. Hasn't taken any medications for symptoms. No leg pain or swelling. No history of blood clots. Takes vitamins regularly but this hasn't changed. Doesn't smoke regularly, states he had a cigarette last week and prior to that on NYE. Without contributing medical history.    ROS per HPI, negative if not otherwise mentioned.       History reviewed. No pertinent past medical history.  There are no active problems to display for this patient.   History reviewed. No pertinent surgical history.     Home Medications    Prior to Admission medications   Not on File    Family History Family History  Problem Relation Age of Onset  . Heart disease Maternal Grandmother     Social History Social History   Tobacco Use  . Smoking status: Light Tobacco Smoker    Types: Cigarettes    Start date: 10/17/2011  . Smokeless tobacco: Never Used  . Tobacco comment: Smokes intermittently, just one or two every now and then  Substance Use Topics  . Alcohol use: Yes    Alcohol/week: 6.0 standard drinks    Types: 6 Standard drinks or equivalent per week  . Drug use: No     Allergies   Patient has no known allergies.   Review of Systems Review of Systems   Physical Exam Triage Vital Signs ED Triage Vitals [05/05/18 1028]  Enc Vitals Group     BP (!) 159/90     Pulse Rate 73     Resp 18     Temp 98.3 F (36.8 C)     Temp Source Oral     SpO2 100 %     Weight      Height      Head Circumference      Peak Flow      Pain Score 3     Pain Loc      Pain Edu?      Excl. in GC?    No data found.  Updated Vital Signs BP (!) 159/90 (BP Location: Right Arm)   Pulse 73   Temp 98.3 F (36.8 C) (Oral)   Resp 18   SpO2 100%    Physical Exam Vitals signs reviewed.  Constitutional:      Appearance: He is well-developed.  HENT:     Head: Normocephalic and atraumatic.     Right Ear: Tympanic membrane, ear canal and external ear normal.     Left Ear: Tympanic membrane, ear canal and external ear normal.     Nose: Nose normal.     Right  Sinus: No maxillary sinus tenderness or frontal sinus tenderness.     Left Sinus: No maxillary sinus tenderness or frontal sinus tenderness.     Mouth/Throat:     Pharynx: Uvula midline.  Eyes:     Conjunctiva/sclera: Conjunctivae normal.     Pupils: Pupils are equal, round, and reactive to light.  Neck:     Musculoskeletal: Normal range of motion.  Cardiovascular:     Rate and Rhythm: Normal rate and regular rhythm.  Pulmonary:     Effort: Pulmonary effort is normal.     Breath sounds: Normal breath sounds.  Lymphadenopathy:     Cervical: No cervical adenopathy.  Skin:    General: Skin is warm and dry.  Neurological:     Mental Status: He is alert and oriented to person, place, and time.     In room for 15 minutes to complete H&P.  UC Treatments / Results  Labs (all labs ordered are listed, but only abnormal results are displayed) Labs Reviewed  RESPIRATORY PANEL BY PCR  NOVEL CORONAVIRUS, NAA (HOSPITAL ORDER, SEND-OUT TO REF LAB)  POCT INFLUENZA A/B    EKG None  Radiology No results  found.  Procedures Procedures (including critical care time)  Medications Ordered in UC Medications - No data to display  Initial Impression / Assessment and Plan / UC Course  I have reviewed the triage vital signs and the nursing notes.  Pertinent labs & imaging results that were available during my care of the patient were reviewed by me and considered in my medical decision making (see chart for details).     Non toxic. Benign physical exam.  Vitals stable. No hypoxia, tachypnea, tachycardia. Travel is only risk factor for PE. No known ill contacts. Negative influenza, RVP and covid pending related to travel. Low risk patient for Covid-19. Encouraged self isolate untile covid results however, patient agreeable. Return precautions provided. Patient verbalized understanding and agreeable to plan.    Final Clinical Impressions(s) / UC Diagnoses   Final diagnoses:  Chest congestion     Discharge Instructions     You are negative for influenza here today.  You are ultimately currently low risk for even Covid-19, as you have not been to the countries which have high levels of this virus, and you do not have specific symptoms- fever, cough, congestion.  We are running a viral panel which should come back later today which looks at common viruses.  We have Covid-19 running as well, this takes approximately 3-4 days.  Will notify of any positive findings and if any changes to treatment are needed.   Due to recent travel we do recommend self isolation until Covid-19 test results.  You are otherwise young and healthy, I wouldn't expect any further complications for you.  Please return if develop worsening of symptoms- increased chest pain , difficulty breathing.     ED Prescriptions    None     Controlled Substance Prescriptions [place] Controlled Substance Registry consulted? Not Applicable   Georgetta Haber, NP 05/05/18 1122

## 2018-05-05 NOTE — Discharge Instructions (Signed)
You are negative for influenza here today.  You are ultimately currently low risk for even Covid-19, as you have not been to the countries which have high levels of this virus, and you do not have specific symptoms- fever, cough, congestion.  We are running a viral panel which should come back later today which looks at common viruses.  We have Covid-19 running as well, this takes approximately 3-4 days.  Will notify of any positive findings and if any changes to treatment are needed.   Due to recent travel we do recommend self isolation until Covid-19 test results.  You are otherwise young and healthy, I wouldn't expect any further complications for you.  Please return if develop worsening of symptoms- increased chest pain , difficulty breathing.

## 2018-05-05 NOTE — ED Triage Notes (Signed)
Pt here with hx of chest congestion; denies cough; pt sts some HA this am; denies fever; pt recently returned from [place] visited [place], [place], [place] and [place]

## 2018-05-09 ENCOUNTER — Encounter (HOSPITAL_COMMUNITY): Payer: Self-pay | Admitting: Emergency Medicine

## 2018-05-12 ENCOUNTER — Telehealth (HOSPITAL_COMMUNITY): Payer: Self-pay | Admitting: Emergency Medicine

## 2018-05-12 LAB — NOVEL CORONAVIRUS, NAA (HOSP ORDER, SEND-OUT TO REF LAB; TAT 18-24 HRS): SARS-CoV-2, NAA: NOT DETECTED

## 2018-05-12 NOTE — Telephone Encounter (Signed)
Spoke to pt verbalized understanding of test results  

## 2018-12-17 ENCOUNTER — Other Ambulatory Visit: Payer: Self-pay

## 2018-12-17 ENCOUNTER — Encounter (HOSPITAL_COMMUNITY): Payer: Self-pay | Admitting: *Deleted

## 2018-12-17 ENCOUNTER — Emergency Department (HOSPITAL_COMMUNITY): Payer: No Typology Code available for payment source

## 2018-12-17 DIAGNOSIS — I10 Essential (primary) hypertension: Secondary | ICD-10-CM | POA: Diagnosis not present

## 2018-12-17 DIAGNOSIS — Z87891 Personal history of nicotine dependence: Secondary | ICD-10-CM | POA: Insufficient documentation

## 2018-12-17 DIAGNOSIS — R0789 Other chest pain: Secondary | ICD-10-CM | POA: Insufficient documentation

## 2018-12-17 DIAGNOSIS — R079 Chest pain, unspecified: Secondary | ICD-10-CM | POA: Diagnosis present

## 2018-12-17 LAB — CBC
HCT: 45.4 % (ref 39.0–52.0)
Hemoglobin: 15.5 g/dL (ref 13.0–17.0)
MCH: 30.1 pg (ref 26.0–34.0)
MCHC: 34.1 g/dL (ref 30.0–36.0)
MCV: 88.2 fL (ref 80.0–100.0)
Platelets: 296 10*3/uL (ref 150–400)
RBC: 5.15 MIL/uL (ref 4.22–5.81)
RDW: 11.6 % (ref 11.5–15.5)
WBC: 5.1 10*3/uL (ref 4.0–10.5)
nRBC: 0 % (ref 0.0–0.2)

## 2018-12-17 LAB — BASIC METABOLIC PANEL
Anion gap: 8 (ref 5–15)
BUN: 13 mg/dL (ref 6–20)
CO2: 27 mmol/L (ref 22–32)
Calcium: 10 mg/dL (ref 8.9–10.3)
Chloride: 103 mmol/L (ref 98–111)
Creatinine, Ser: 1.11 mg/dL (ref 0.61–1.24)
GFR calc Af Amer: >60 mL/min (ref >60)
GFR calc non Af Amer: >60 mL/min (ref >60)
Glucose, Bld: 111 mg/dL — ABNORMAL HIGH (ref 70–99)
Potassium: 4.2 mmol/L (ref 3.5–5.1)
Sodium: 138 mmol/L (ref 135–145)

## 2018-12-17 LAB — TROPONIN I (HIGH SENSITIVITY): Troponin I (High Sensitivity): <2 ng/L (ref <18)

## 2018-12-17 NOTE — ED Triage Notes (Signed)
Pt states he noticed that around 19:00 he noticed that he had elevated BP. Pt also states that he had arm and chest pain around 14:00.  Pt states that he doesn't have chest pain or arm pain in triage. Pt has been worked up for chest pain at his PCP about two weeks ago and was found to be negative.

## 2018-12-18 ENCOUNTER — Emergency Department (HOSPITAL_COMMUNITY)
Admission: EM | Admit: 2018-12-18 | Discharge: 2018-12-18 | Disposition: A | Discharge status: Home / Self Care | Payer: No Typology Code available for payment source | Attending: Emergency Medicine | Admitting: Emergency Medicine

## 2018-12-18 DIAGNOSIS — I10 Essential (primary) hypertension: Secondary | ICD-10-CM

## 2018-12-18 DIAGNOSIS — R0789 Other chest pain: Secondary | ICD-10-CM

## 2018-12-18 NOTE — ED Provider Notes (Signed)
TIME SEEN: 2:50 AM  CHIEF COMPLAINT: Chest pain, hypertension  HPI: Patient is a 28 year old male with history of tobacco use who presents to the emergency department with left-sided chest pressure that has been intermittent with pain and weakness in the left arm.  Mother states that he felt short of breath earlier today but he denies this.  He states he did feel dizzy.  No nausea, vomiting or diaphoresis.  Pain is now resolved.  Came on at rest.  No aggravating or alleviating factors.  When he had chest pain his mother checked his blood pressure and it was 128/94.  She states she began checking it every 15 minutes and it continued to rise.  It was in the 150s/100s and she called her family member who is a physician who recommended the patient come to the emergency department.  He has no known history of CAD, hypertension.  He has had increased stress recently.  He has been having intermittent episodes of chest pain and is followed at [facility] and says he had an outpatient echocardiogram that he tells me was normal.  He does not have a cardiologist.  No history of PE, DVT, exogenous estrogen use, recent fractures, surgery, trauma, hospitalization or prolonged travel. No lower extremity swelling or pain. No calf tenderness.  Denies fevers, cough, congestion, vomiting or diarrhea, loss of taste or smell.  ROS: See HPI Constitutional: no fever  Eyes: no drainage  ENT: no runny nose   Cardiovascular:   chest pain  Resp: no SOB  GI: no vomiting GU: no dysuria Integumentary: no rash  Allergy: no hives  Musculoskeletal: no leg swelling  Neurological: no slurred speech ROS otherwise negative  PAST MEDICAL HISTORY/PAST SURGICAL HISTORY:  History reviewed. No pertinent past medical history.  MEDICATIONS:  Prior to Admission medications   Not on File    ALLERGIES:  No Known Allergies  SOCIAL HISTORY:  Social History   Tobacco Use  . Smoking status: Former Smoker    Types:  Cigarettes    Start date: 10/17/2011  . Smokeless tobacco: Never Used  . Tobacco comment: Smokes intermittently, just one or two every now and then  Substance Use Topics  . Alcohol use: Yes    Comment: social    FAMILY HISTORY: Family History  Problem Relation Age of Onset  . Heart disease Maternal Grandmother     EXAM: BP (!) 150/110 (BP Location: Left Arm)   Pulse 75   Temp 98.4 F (36.9 C) (Oral)   Resp 16   Ht 5\' 8"  (1.727 m)   Wt 68 kg   SpO2 99%   BMI 22.81 kg/m  CONSTITUTIONAL: Alert and oriented and responds appropriately to questions. Well-appearing; well-nourished HEAD: Normocephalic EYES: Conjunctivae clear, pupils appear equal, EOMI ENT: normal nose; moist mucous membranes NECK: Supple, no meningismus, no nuchal rigidity, no LAD  CARD: RRR; S1 and S2 appreciated; no murmurs, no clicks, no rubs, no gallops RESP: Normal chest excursion without splinting or tachypnea; breath sounds clear and equal bilaterally; no wheezes, no rhonchi, no rales, no hypoxia or respiratory distress, speaking full sentences ABD/GI: Normal bowel sounds; non-distended; soft, non-tender, no rebound, no guarding, no peritoneal signs, no hepatosplenomegaly BACK:  The back appears normal and is non-tender to palpation, there is no CVA tenderness EXT: Normal ROM in all joints; non-tender to palpation; no edema; normal capillary refill; no cyanosis, no calf tenderness or swelling    SKIN: Normal color for age and race; warm; no rash NEURO:  Moves all extremities equally, strength 5/5 in all 4 extremities, cranial nerves II through XII intact, normal speech, normal gait, normal sensation diffusely PSYCH: The patient's mood and manner are appropriate. Grooming and personal hygiene are appropriate.  MEDICAL DECISION MAKING: Patient here with atypical chest pain.  He has no risk factors for CAD.  Family reports blood pressure was elevated at home after checking it multiple times.  Initially elevated  when he arrived to the emergency department but has improved without intervention is now in the 130s/90s.  He states he has had a recent echocardiogram that was unremarkable.  His troponin that was obtained several hours after the onset of symptoms is negative.  His EKG shows no ischemia, or interval abnormality, arrhythmia.  Heart and lung sounds are normal.  He has no murmur.  No family history of premature CAD.  Doubt dissection.  His chest x-ray is clear without pneumonia, pneumothorax, pulmonary edema.  No recent infectious symptoms.  He has no risk factors for PE and is PERC negative.  I do not feel that he needs further emergent work-up at this time.  Have recommended that they check his blood pressure 1-2 times a day and keep a log of this so that they can take it to the primary care physician at [facility] who can then determine if he should be started on medications.  I do not recommend medication to be started from the ED given his blood pressures have improved to the 130s/90s here and he is currently asymptomatic.  Patient and mother comfortable with this plan.  They have also requested cardiology follow-up information.  This information has been provided.   At this time, I do not feel there is any life-threatening condition present. I have reviewed and discussed all results (EKG, imaging, lab, urine as appropriate) and exam findings with patient/family. I have reviewed nursing notes and appropriate previous records.  I feel the patient is safe to be discharged home without further emergent workup and can continue workup as an outpatient as needed. Discussed usual and customary return precautions. Patient/family verbalize understanding and are comfortable with this plan.  Outpatient follow-up has been provided as needed. All questions have been answered.   Zachary Crawford was evaluated in Emergency Department on 12/18/2018 for the symptoms described in the history of present illness. He was  evaluated in the context of the global COVID-19 pandemic, which necessitated consideration that the patient might be at risk for infection with the SARS-CoV-2 virus that causes COVID-19. Institutional protocols and algorithms that pertain to the evaluation of patients at risk for COVID-19 are in a state of rapid change based on information released by regulatory bodies including the CDC and federal and state organizations. These policies and algorithms were followed during the patient's care in the ED.    EKG Interpretation  Date/Time:  Wednesday December 17 2018 21:57:16 EDT Ventricular Rate:  102 PR Interval:    QRS Duration: 80 QT Interval:  315 QTC Calculation: 411 R Axis:   74 Text Interpretation: Sinus tachycardia Borderline T abnormalities, lateral leads Baseline wander in lead(s) I II aVR aVF No old tracing to compare Confirmed by Ward, Cyril Mourning [phone]) on 12/18/2018 2:50:24 AM         Ward, Delice Bison, DO 12/18/18 [id number]

## 2018-12-18 NOTE — Discharge Instructions (Signed)
Your labs, EKG and chest x-ray today were normal.  You have had fluctuating blood pressures.  I recommend that you take your blood pressure twice a day for the next 1 to 2 weeks and follow-up with your primary care physician to determine if you should be on blood pressure medications.  You have had some blood pressures that have been in the 130s/80s here which is reassuring and I do not recommend starting you on blood pressure medication currently for intermittent isolated high blood pressures.

## 2018-12-18 NOTE — ED Notes (Signed)
ED Provider at bedside. 

## 2018-12-18 NOTE — ED Notes (Signed)
Pt states chest pain has been an ongoing issue for the past few months. He states he recently had a chest xray and echo done about 2-3 weeks ago which were both unremarkable as per patient. Pt states he has had associated left arm weakness. Pt denies pain radiation to jaw or back. Pt denies nausea, vomiting or visual disturbance. Pt denies history of high blood pressure. Pts current Bp 150/110. Pt denies active pain right now.

## 2019-04-30 NOTE — Telephone Encounter (Signed)
done

## 2021-05-09 ENCOUNTER — Other Ambulatory Visit: Payer: Self-pay

## 2021-05-09 ENCOUNTER — Ambulatory Visit (INDEPENDENT_AMBULATORY_CARE_PROVIDER_SITE_OTHER): Payer: 59 | Admitting: Orthopedic Surgery

## 2021-05-09 DIAGNOSIS — S86011A Strain of right Achilles tendon, initial encounter: Secondary | ICD-10-CM | POA: Diagnosis not present

## 2021-05-23 ENCOUNTER — Encounter: Payer: Self-pay | Admitting: Orthopedic Surgery

## 2021-05-23 NOTE — Progress Notes (Signed)
? ?Office Visit Note ?  ?Patient: Zachary Crawford           ?Date of Birth: 1990/12/19           ?MRN: [id number] ?Visit Date: 05/09/2021 ?             ?Requested by: Sherald Hess., MD ?[address] ?[place],  [place] [address] ?PCP: Sherald Hess., MD ? ?Chief Complaint  ?Patient presents with  ? Right Foot - Injury  ?  Achilles tendon rupture  ? ? ? ? ?HPI: ?Patient is a 31 year old gentleman who presents with acute right Achilles tendon injury while playing soccer.  Patient states he felt a pop last night while playing soccer.  Patient states when he flexes his foot the calf muscle does not move. ? ?Assessment & Plan: ?Visit Diagnoses:  ?1. Achilles rupture, right, initial encounter   ? ? ?Plan: Discussed patient has a Achilles tendon rupture at the musculotendinous junction.  Discussed recommendation to proceed with reconstruction of the Achilles tendon.  Patient states he would like to proceed with conservative therapy without surgery.  We will place him in a short leg cast with 2 heel lifts he may be weightbearing as tolerated ? ?Follow-Up Instructions: Return in about 3 weeks (around 05/30/2021).  ? ?Ortho Exam ? ?Patient is alert, oriented, no adenopathy, well-dressed, normal affect, normal respiratory effort. ?Examination patient has a good pulse with the patient kneeling compression of the calf does not reproduce any plantarflexion of the foot.  The tendon is intact but patient does have a palpable defect at the musculotendinous junction. ? ?Imaging: ?No results found. ?No images are attached to the encounter. ? ?Labs: ?Lab Results  ?Component Value Date  ? LABORGA GROUP A STREP (S.PYOGENES) ISOLATED 09/18/2011  ? ? ? ?Lab Results  ?Component Value Date  ? ALBUMIN 4.4 04/06/2015  ? ALBUMIN 4.5 10/16/2012  ? ? ?No results found for: MG ?No results found for: VD25OH ? ?No results found for: PREALBUMIN ? ?  Latest Ref Rng & Units 12/17/2018  ? 10:31 PM 04/06/2015  ? 10:03 AM 10/16/2012  ? 12:22 PM   ?CBC EXTENDED  ?WBC 4.0 - 10.5 K/uL 5.1   7.9   5.2    ?RBC 4.22 - 5.81 MIL/uL 5.15   5.28   4.85    ?Hemoglobin 13.0 - 17.0 g/dL 15.5   15.9   15.4    ?HCT 39.0 - 52.0 % 45.4   46.9   46.9    ?Platelets 150 - 400 K/uL 296   266     ? ? ? ?There is no height or weight on file to calculate BMI. ? ?Orders:  ?No orders of the defined types were placed in this encounter. ? ?No orders of the defined types were placed in this encounter. ? ? ? Procedures: ?No procedures performed ? ?Clinical Data: ?No additional findings. ? ?ROS: ? ?All other systems negative, except as noted in the HPI. ?Review of Systems ? ?Objective: ?Vital Signs: There were no vitals taken for this visit. ? ?Specialty Comments:  ?No specialty comments available. ? ?PMFS History: ?There are no problems to display for this patient. ? ?History reviewed. No pertinent past medical history.  ?Family History  ?Problem Relation Age of Onset  ? Heart disease Maternal Grandmother   ?  ?Past Surgical History:  ?Procedure Laterality Date  ? NOSE SURGERY    ? ?Social History  ? ?Occupational History  ? Occupation: Air traffic controller  ?  Tobacco Use  ? Smoking status: Former  ?  Types: Cigarettes  ?  Start date: 10/17/2011  ? Smokeless tobacco: Never  ? Tobacco comments:  ?  Smokes intermittently, just one or two every now and then  ?Vaping Use  ? Vaping Use: Never used  ?Substance and Sexual Activity  ? Alcohol use: Yes  ?  Comment: social  ? Drug use: Yes  ?  Types: Marijuana  ?  Comment: last use was 12/06/18  ? Sexual activity: Not on file  ? ? ? ? ? ?

## 2021-05-30 ENCOUNTER — Ambulatory Visit (INDEPENDENT_AMBULATORY_CARE_PROVIDER_SITE_OTHER): Payer: 59 | Admitting: Orthopedic Surgery

## 2021-05-30 DIAGNOSIS — S86011A Strain of right Achilles tendon, initial encounter: Secondary | ICD-10-CM | POA: Diagnosis not present

## 2021-06-14 ENCOUNTER — Encounter: Payer: Self-pay | Admitting: Orthopedic Surgery

## 2021-06-14 NOTE — Progress Notes (Signed)
? ?Office Visit Note ?  ?Patient: Zachary Crawford           ?Date of Birth: October 30, 1990           ?MRN: [id number] ?Visit Date: 05/30/2021 ?             ?Requested by: Frederich Chick., MD ?[address] ?[place],  [place] [address] ?PCP: Frederich Chick., MD ? ?Chief Complaint  ?Patient presents with  ? Right Foot - Injury  ?  Achilles rupture  ? ? ? ? ?HPI: ?Patient is a 31-year-old gentleman is seen in follow-up for a partial Achilles tendon rupture on the right.  Patient has been in a cast with 2 heel lifts. ? ?Assessment & Plan: ?Visit Diagnoses:  ?1. Achilles rupture, right, initial encounter   ? ? ?Plan: Patient is healing well we will place him in a fracture boot with 2 heel lifts he will work on gentle range of motion of the ankle at follow-up in 3 weeks may advance into a sneaker with 1 heel lift. ? ?Follow-Up Instructions: Return in about 3 weeks (around 06/20/2021).  ? ?Ortho Exam ? ?Patient is alert, oriented, no adenopathy, well-dressed, normal affect, normal respiratory effort. ?Examination compression of the calf reproduces plantarflexion of the foot.  Patient has interval healing of the Achilles rupture there is no redness no cellulitis no ulcers. ? ?Imaging: ?No results found. ?No images are attached to the encounter. ? ?Labs: ?Lab Results  ?Component Value Date  ? LABORGA GROUP A STREP (S.PYOGENES) ISOLATED 09/18/2011  ? ? ? ?Lab Results  ?Component Value Date  ? ALBUMIN 4.4 04/06/2015  ? ALBUMIN 4.5 10/16/2012  ? ? ?No results found for: MG ?No results found for: VD25OH ? ?No results found for: PREALBUMIN ? ?  Latest Ref Rng & Units 12/17/2018  ? 10:31 PM 04/06/2015  ? 10:03 AM 10/16/2012  ? 12:22 PM  ?CBC EXTENDED  ?WBC 4.0 - 10.5 K/uL 5.1   7.9   5.2    ?RBC 4.22 - 5.81 MIL/uL 5.15   5.28   4.85    ?Hemoglobin 13.0 - 17.0 g/dL 35.4   65.6   81.2    ?HCT 39.0 - 52.0 % 45.4   46.9   46.9    ?Platelets 150 - 400 K/uL 296   266     ? ? ? ?There is no height or weight on file to calculate  BMI. ? ?Orders:  ?No orders of the defined types were placed in this encounter. ? ?No orders of the defined types were placed in this encounter. ? ? ? Procedures: ?No procedures performed ? ?Clinical Data: ?No additional findings. ? ?ROS: ? ?All other systems negative, except as noted in the HPI. ?Review of Systems ? ?Objective: ?Vital Signs: There were no vitals taken for this visit. ? ?Specialty Comments:  ?No specialty comments available. ? ?PMFS History: ?There are no problems to display for this patient. ? ?History reviewed. No pertinent past medical history.  ?Family History  ?Problem Relation Age of Onset  ? Heart disease Maternal Grandmother   ?  ?Past Surgical History:  ?Procedure Laterality Date  ? NOSE SURGERY    ? ?Social History  ? ?Occupational History  ? Occupation: Armed forces training and education officer  ?Tobacco Use  ? Smoking status: Former  ?  Types: Cigarettes  ?  Start date: 10/17/2011  ? Smokeless tobacco: Never  ? Tobacco comments:  ?  Smokes intermittently, just one or two every now  and then  ?Vaping Use  ? Vaping Use: Never used  ?Substance and Sexual Activity  ? Alcohol use: Yes  ?  Comment: social  ? Drug use: Yes  ?  Types: Marijuana  ?  Comment: last use was 12/06/18  ? Sexual activity: Not on file  ? ? ? ? ? ?

## 2021-06-20 ENCOUNTER — Encounter: Payer: Self-pay | Admitting: Orthopedic Surgery

## 2021-06-20 ENCOUNTER — Ambulatory Visit (INDEPENDENT_AMBULATORY_CARE_PROVIDER_SITE_OTHER): Payer: 59 | Admitting: Orthopedic Surgery

## 2021-06-20 DIAGNOSIS — S86011A Strain of right Achilles tendon, initial encounter: Secondary | ICD-10-CM | POA: Diagnosis not present

## 2021-06-20 NOTE — Progress Notes (Signed)
? ?Office Visit Note ?  ?Patient: Zachary Crawford           ?Date of Birth: [birth year]           ?MRN: [id number] ?Visit Date: 06/20/2021 ?             ?Requested by: Sherald Hess., MD ?[address] ?[place],  [place] [address] ?PCP: Sherald Hess., MD ? ?Chief Complaint  ?Patient presents with  ? Right Achilles Tendon - Follow-up  ? ? ? ? ?HPI: ?Patient is a 31 year old gentleman who is 6 weeks status post right Achilles tendon rupture at the musculotendinous junction without displacement.  Patient was in a fracture boot protected weightbearing with crutches with 03/30/2014 since heel lifts.  Patient states that he is feeling much better. ? ?Assessment & Plan: ?Visit Diagnoses:  ?1. Achilles rupture, right, initial encounter   ? ? ?Plan: Patient will advance to a stiff soled sneaker with a 916s inch heel lift.  Patient will start with toe raises while seated to strengthen the fascia.  He will advance to standing toe raises as he feels comfortable.  Continue to increase his activities and strengthening as tolerated as long as he is asymptomatic.  Recommended against returning to sports until at least 6 months out. ? ?Follow-Up Instructions: Return if symptoms worsen or fail to improve.  ? ?Ortho Exam ? ?Patient is alert, oriented, no adenopathy, well-dressed, normal affect, normal respiratory effort. ?Examination patient has a strong dorsalis pedis pulse with compression of the calf he has excellent plantarflexion of the foot.  There is no palpable defect at the musculotendinous junction where the injury occurred the injury site is not tender to palpation. ? ?Imaging: ?No results found. ?No images are attached to the encounter. ? ?Labs: ?Lab Results  ?Component Value Date  ? LABORGA GROUP A STREP (S.PYOGENES) ISOLATED 09/18/2011  ? ? ? ?Lab Results  ?Component Value Date  ? ALBUMIN 4.4 04/06/2015  ? ALBUMIN 4.5 10/16/2012  ? ? ?No results found for: MG ?No results found for: VD25OH ? ?No results found for:  PREALBUMIN ? ?  Latest Ref Rng & Units 12/17/2018  ? 10:31 PM 04/06/2015  ? 10:03 AM 10/16/2012  ? 12:22 PM  ?CBC EXTENDED  ?WBC 4.0 - 10.5 K/uL 5.1   7.9   5.2    ?RBC 4.22 - 5.81 MIL/uL 5.15   5.28   4.85    ?Hemoglobin 13.0 - 17.0 g/dL 15.5   15.9   15.4    ?HCT 39.0 - 52.0 % 45.4   46.9   46.9    ?Platelets 150 - 400 K/uL 296   266     ? ? ? ?There is no height or weight on file to calculate BMI. ? ?Orders:  ?No orders of the defined types were placed in this encounter. ? ?No orders of the defined types were placed in this encounter. ? ? ? Procedures: ?No procedures performed ? ?Clinical Data: ?No additional findings. ? ?ROS: ? ?All other systems negative, except as noted in the HPI. ?Review of Systems ? ?Objective: ?Vital Signs: There were no vitals taken for this visit. ? ?Specialty Comments:  ?No specialty comments available. ? ?PMFS History: ?There are no problems to display for this patient. ? ?History reviewed. No pertinent past medical history.  ?Family History  ?Problem Relation Age of Onset  ? Heart disease Maternal Grandmother   ?  ?Past Surgical History:  ?Procedure Laterality Date  ? NOSE  SURGERY    ? ?Social History  ? ?Occupational History  ? Occupation: Air traffic controller  ?Tobacco Use  ? Smoking status: Former  ?  Types: Cigarettes  ?  Start date: 10/17/2011  ? Smokeless tobacco: Never  ? Tobacco comments:  ?  Smokes intermittently, just one or two every now and then  ?Vaping Use  ? Vaping Use: Never used  ?Substance and Sexual Activity  ? Alcohol use: Yes  ?  Comment: social  ? Drug use: Yes  ?  Types: Marijuana  ?  Comment: last use was 12/06/18  ? Sexual activity: Not on file  ? ? ? ? ? ?

## 2021-07-27 ENCOUNTER — Emergency Department (HOSPITAL_COMMUNITY): Payer: 59

## 2021-07-27 ENCOUNTER — Other Ambulatory Visit: Payer: Self-pay

## 2021-07-27 ENCOUNTER — Emergency Department (HOSPITAL_COMMUNITY)
Admission: EM | Admit: 2021-07-27 | Discharge: 2021-07-27 | Disposition: A | Discharge status: Home / Self Care | Payer: 59 | Attending: Emergency Medicine | Admitting: Emergency Medicine

## 2021-07-27 DIAGNOSIS — R000 Tachycardia, unspecified: Secondary | ICD-10-CM | POA: Diagnosis not present

## 2021-07-27 DIAGNOSIS — R002 Palpitations: Secondary | ICD-10-CM | POA: Diagnosis present

## 2021-07-27 LAB — COMPREHENSIVE METABOLIC PANEL
ALT: 27 U/L (ref 0–44)
AST: 20 U/L (ref 15–41)
Albumin: 4.2 g/dL (ref 3.5–5.0)
Alkaline Phosphatase: 56 U/L (ref 38–126)
Anion gap: 9 (ref 5–15)
BUN: 18 mg/dL (ref 6–20)
CO2: 25 mmol/L (ref 22–32)
Calcium: 9.4 mg/dL (ref 8.9–10.3)
Chloride: 107 mmol/L (ref 98–111)
Creatinine, Ser: 1.1 mg/dL (ref 0.61–1.24)
GFR, Estimated: >60 mL/min (ref >60)
Glucose, Bld: 105 mg/dL — ABNORMAL HIGH (ref 70–99)
Potassium: 3.7 mmol/L (ref 3.5–5.1)
Sodium: 141 mmol/L (ref 135–145)
Total Bilirubin: 0.5 mg/dL (ref 0.3–1.2)
Total Protein: 6.8 g/dL (ref 6.5–8.1)

## 2021-07-27 LAB — CBC WITH DIFFERENTIAL/PLATELET
Abs Immature Granulocytes: 0.02 10*3/uL (ref 0.00–0.07)
Basophils Absolute: 0.1 10*3/uL (ref 0.0–0.1)
Basophils Relative: 1 %
Eosinophils Absolute: 0.1 10*3/uL (ref 0.0–0.5)
Eosinophils Relative: 1 %
HCT: 40.9 % (ref 39.0–52.0)
Hemoglobin: 14 g/dL (ref 13.0–17.0)
Immature Granulocytes: 0 %
Lymphocytes Relative: 21 %
Lymphs Abs: 1.4 10*3/uL (ref 0.7–4.0)
MCH: 29.6 pg (ref 26.0–34.0)
MCHC: 34.2 g/dL (ref 30.0–36.0)
MCV: 86.5 fL (ref 80.0–100.0)
Monocytes Absolute: 0.6 10*3/uL (ref 0.1–1.0)
Monocytes Relative: 8 %
Neutro Abs: 4.6 10*3/uL (ref 1.7–7.7)
Neutrophils Relative %: 69 %
Platelets: 241 10*3/uL (ref 150–400)
RBC: 4.73 MIL/uL (ref 4.22–5.81)
RDW: 11.7 % (ref 11.5–15.5)
WBC: 6.7 10*3/uL (ref 4.0–10.5)
nRBC: 0 % (ref 0.0–0.2)

## 2021-07-27 LAB — MAGNESIUM: Magnesium: 1.9 mg/dL (ref 1.7–2.4)

## 2021-07-27 LAB — TSH: TSH: 2.01 u[IU]/mL (ref 0.350–4.500)

## 2021-07-27 NOTE — ED Provider Notes (Signed)
[facility] EMERGENCY DEPARTMENT Provider Note   CSN: [id number] Arrival date & time: 07/27/21  1931     History  Chief Complaint  Patient presents with   Tachycardia    Zachary Crawford is a 32 y.o. male.  Patient is a 31 year old male with no significant past medical history presenting today due to palpitations.  Patient reports that he was in his normal state of health and had gone for a walk today as he is recovering from a right Achilles tendon rupture and reports after he got home and he was sitting he suddenly started feeling his heart pounding and racing.  He put on his Apple Watch and it read a heart rate of 180 and then jumped up to 210.  He denied any chest pain or shortness of breath during this episode but he did call EMS.  He reports it took them about 15 minutes to get there and by the time they had gotten there he was starting to feel better and his heart rate had improved to the low 100s.  He reports he did have a history of feeling something similar several years day ago but it was not to that extent.  He was seen in the emergency room but nothing was found and he was discharged home.  He denies any drug use, does not smoke and does not drink excessive caffeine.  He does take zinc supplement but no other supplements.  The history is provided by the patient.       Home Medications Prior to Admission medications   Not on File      Allergies    Patient has no known allergies.    Review of Systems   Review of Systems  Physical Exam Updated Vital Signs BP 126/80   Pulse 84   Temp 99 F (37.2 C) (Oral)   Resp 13   SpO2 97%  Physical Exam Vitals and nursing note reviewed.  Constitutional:      General: He is not in acute distress.    Appearance: He is well-developed.  HENT:     Head: Normocephalic and atraumatic.  Eyes:     Conjunctiva/sclera: Conjunctivae normal.     Pupils: Pupils are equal, round, and reactive to light.   Cardiovascular:     Rate and Rhythm: Regular rhythm. Tachycardia present.     Heart sounds: No murmur heard. Pulmonary:     Effort: Pulmonary effort is normal. No respiratory distress.     Breath sounds: Normal breath sounds. No wheezing or rales.  Abdominal:     General: There is no distension.     Palpations: Abdomen is soft.     Tenderness: There is no abdominal tenderness. There is no guarding or rebound.  Musculoskeletal:        General: No tenderness. Normal range of motion.     Cervical back: Normal range of motion and neck supple.  Skin:    General: Skin is warm and dry.     Findings: No erythema or rash.  Neurological:     Mental Status: He is alert and oriented to person, place, and time. Mental status is at baseline.  Psychiatric:        Mood and Affect: Mood normal.        Behavior: Behavior normal.     ED Results / Procedures / Treatments   Labs (all labs ordered are listed, but only abnormal results are displayed) Labs Reviewed  COMPREHENSIVE METABOLIC PANEL - Abnormal;  Notable for the following components:      Result Value   Glucose, Bld 105 (*)    All other components within normal limits  CBC WITH DIFFERENTIAL/PLATELET  MAGNESIUM  TSH    EKG EKG Interpretation  Date/Time:  Thursday July 27 2021 19:47:38 EDT Ventricular Rate:  104 PR Interval:  137 QRS Duration: 78 QT Interval:  329 QTC Calculation: 433 R Axis:   82 Text Interpretation: Sinus tachycardia Consider left atrial enlargement No significant change since last tracing Confirmed by Gwyneth Sprout ([id number]) on 07/27/2021 9:12:36 PM  Radiology DG Chest 2 View  Result Date: 07/27/2021 CLINICAL DATA:  Palpitations. EXAM: CHEST - 2 VIEW COMPARISON:  December 17, 2018 FINDINGS: The heart size and mediastinal contours are within normal limits. Both lungs are clear. The visualized skeletal structures are unremarkable. IMPRESSION: No active cardiopulmonary disease. Electronically Signed   By:  Aram Candela M.D.   On: 07/27/2021 22:04    Procedures Procedures    Medications Ordered in ED Medications - No data to display  ED Course/ Medical Decision Making/ A&P                           Medical Decision Making Amount and/or Complexity of Data Reviewed Independent Historian: EMS Labs: ordered. Decision-making details documented in ED Course. Radiology: ordered and independent interpretation performed. Decision-making details documented in ED Course. ECG/medicine tests: ordered and independent interpretation performed. Decision-making details documented in ED Course.   Patient is a healthy 31 year old male presenting today with palpitations and based on his Apple Watch heart rate of 200.  Symptoms resolved by the time EMS arrived and it was not captured by EKG.  Upon arrival here patient has had a heart rate as high as 110 but dropped to 87 while I was in the room and appears to be in a sinus rhythm.  He has no history to suggest structural heart disease.  Symptoms are not consistent with ACS.  It did occur at rest.  Patient did recently have an Achilles rupture and has been walking and allowing it to heal but has no calf pain or symptoms suggestive of a PE.  He does not use stimulants or take excessive caffeine.  I independently interpreted his EKG which is normal and in sinus rhythm. I have independently visualized and interpreted pt's images today.  Chest x-ray normal without cardiomegaly.  I independently interpreted patient's labs are all wnl.  Pt clear for d/c.  He was on cardiac monitoring throughout his stay without evidence of dysrhythmia.  Vital signs are normal currently.         Final Clinical Impression(s) / ED Diagnoses Final diagnoses:  Palpitations    Rx / DC Orders ED Discharge Orders          Ordered    Ambulatory referral to Cardiology       Comments: If you have not heard from the Cardiology office within the next 72 hours please call  [phone].   07/27/21 [id number]              Gwyneth Sprout, MD 07/28/21 [phone]

## 2021-07-27 NOTE — ED Notes (Signed)
Patient verbalizes understanding of d/c instructions. Opportunities for questions and answers were provided. Pt d/c from ED and ambulated to lobby.  

## 2021-07-27 NOTE — ED Triage Notes (Signed)
Pt bib [facility] from home . Pt was out walking and when he got home he had difficulty catching a good breath and felt like his HR was racing. Pt put on his apple watch and it read Hr 210s, with EMS his HR was 110-130s. Pt has no medical hx.  EMS vitals 172/98 BP 125 CBG 112 HR 99% O2

## 2021-07-27 NOTE — Discharge Instructions (Signed)
All blood work looks normal today.  Also if you have another event and it does not get better with vagal maneuvers return to the emergency room.  Avoid caffeine products.  Make sure you remain hydrated.  No other restrictions at this time.  Somebody from cardiology should contact you for a follow-up visit.

## 2021-08-01 NOTE — Progress Notes (Signed)
Cardiology Office Note:    Date:  08/01/2021   ID:  Zachary Crawford, DOB 1990-07-10, MRN [id number]  PCP:  Dois Davenport, MD   [facility] Providers Cardiologist:  None     Referring MD: Dois Davenport, MD   No chief complaint on file. Palpitations  History of Present Illness:    Zachary Crawford is a 31 y.o. male with no significant PMHx. Went to the ED for palpitations. His smart watch showed heart rate 180 bpm to 210 bpm. He notices this after R achilles ruptured. EKG was sinus tachycardia.TSH is normal. He denies syncope. No family hx of SCD.  Mother has ?SVT. He saw a cardiologist once with high BP. This has resolved. This has resolved.  Past Surgical History:  Procedure Laterality Date   NOSE SURGERY      Current Medications: No outpatient medications have been marked as taking for the 08/02/21 encounter (Appointment) with Maisie Fus, MD.     Allergies:   Patient has no known allergies.   Social History   Socioeconomic History   Marital status: Single    Spouse name: Not on file   Number of children: Not on file   Years of education: Not on file   Highest education level: Not on file  Occupational History   Occupation: Maintenance Technician  Tobacco Use   Smoking status: Former    Types: Cigarettes    Start date: 10/17/2011   Smokeless tobacco: Never   Tobacco comments:    Smokes intermittently, just one or two every now and then  Vaping Use   Vaping Use: Never used  Substance and Sexual Activity   Alcohol use: Yes    Comment: social   Drug use: Yes    Types: Marijuana    Comment: last use was 12/06/18   Sexual activity: Not on file  Other Topics Concern   Not on file  Social History Narrative   Not on file   Social Determinants of Health   Financial Resource Strain: Not on file  Food Insecurity: Not on file  Transportation Needs: Not on file  Physical Activity: Not on file  Stress: Not on file  Social Connections: Not on file      Family History: The patient's family history includes Heart disease in his maternal grandmother. No premature CAD. Mother has SVT  ROS:   Please see the history of present illness.     All other systems reviewed and are negative.  EKGs/Labs/Other Studies Reviewed:    The following studies were reviewed today:   EKG:  EKG is  ordered today.  The ekg ordered today demonstrates   08/02/2021- NSR  Recent Labs: 07/27/2021: ALT 27; BUN 18; Creatinine, Ser 1.10; Hemoglobin 14.0; Magnesium 1.9; Platelets 241; Potassium 3.7; Sodium 141; TSH 2.010  Recent Lipid Panel    Component Value Date/Time   CHOL 135 10/16/2012 1212   TRIG 90 10/16/2012 1212   HDL 49 10/16/2012 1212   CHOLHDL 2.8 10/16/2012 1212   VLDL 18 10/16/2012 1212   LDLCALC 68 10/16/2012 1212     Risk Assessment/Calculations:           Physical Exam:    VS  Vitals:   08/02/21 0805  BP: 112/82  Pulse: 71  SpO2: 98%     Wt Readings from Last 3 Encounters:  12/17/18 150 lb (68 kg)  12/17/15 150 lb (68 kg)  09/05/15 151 lb 3.2 oz (68.6 kg)     GEN:  Well nourished, well developed in no acute distress HEENT: Normal NECK: No JVD; CARDIAC: RRR, no murmurs, rubs, gallops RESPIRATORY:  Clear to auscultation without rales, wheezing or rhonchi  ABDOMEN: Soft, non-tender, non-distended MUSCULOSKELETAL:  No edema; No deformity  SKIN: Warm and dry NEUROLOGIC:  Alert and oriented x 3 PSYCHIATRIC:  Normal affect   ASSESSMENT:    Palpitations: He does not have high risk features including syncope c/f arrhythmia , family hx of SCD, or abnormalities on her EKG. Sinus tachycardia is a physiologic response to pain. We discussed if has sustained elevated heart rates over 100 to let us know or syncopal events. Otherwise, he has no indication to follow with cardiology at this time.  PLAN:    In order of problems listed above:   No indication for cardiology for follow up           Medication Adjustments/Labs  and Tests Ordered: Current medicines are reviewed at length with the patient today.  Concerns regarding medicines are outlined above.  No orders of the defined types were placed in this encounter.  No orders of the defined types were placed in this encounter.   There are no Patient Instructions on file for this visit.   Signed, Maisie Fus, MD  08/01/2021 2:07 PM    [facility]

## 2021-08-02 ENCOUNTER — Encounter: Payer: Self-pay | Admitting: Internal Medicine

## 2021-08-02 ENCOUNTER — Ambulatory Visit (INDEPENDENT_AMBULATORY_CARE_PROVIDER_SITE_OTHER): Payer: 59 | Admitting: Internal Medicine

## 2021-08-02 VITALS — BP 112/82 | HR 71 | Ht 68.0 in | Wt 150.4 lb

## 2021-08-02 DIAGNOSIS — R002 Palpitations: Secondary | ICD-10-CM

## 2021-08-02 NOTE — Patient Instructions (Signed)
